# Patient Record
Sex: Female | Born: 1990 | Race: White | Hispanic: No | Marital: Single
Health system: Southern US, Community
[De-identification: ages and names within clinical notes are randomized; demographics above are authoritative.]

---
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
## 2012-01-09 ENCOUNTER — Ambulatory Visit: Payer: Self-pay | Admitting: Family Medicine

## 2012-04-28 ENCOUNTER — Ambulatory Visit: Payer: Self-pay | Admitting: Family Medicine

## 2012-05-02 ENCOUNTER — Ambulatory Visit: Payer: Self-pay | Admitting: Family Medicine

## 2012-05-05 ENCOUNTER — Ambulatory Visit: Payer: Self-pay | Admitting: Family Medicine

## 2012-05-28 ENCOUNTER — Ambulatory Visit: Payer: Self-pay | Admitting: Family Medicine

## 2012-06-11 ENCOUNTER — Ambulatory Visit: Payer: Self-pay | Admitting: Family Medicine

## 2012-07-23 ENCOUNTER — Ambulatory Visit: Payer: Self-pay | Admitting: Family Medicine

## 2012-07-30 ENCOUNTER — Ambulatory Visit: Payer: Self-pay | Admitting: Family Medicine

## 2012-09-09 ENCOUNTER — Ambulatory Visit: Payer: Self-pay | Admitting: Family Medicine

## 2012-12-25 ENCOUNTER — Ambulatory Visit: Payer: Self-pay | Admitting: Family Medicine

## 2013-02-09 ENCOUNTER — Emergency Department: Payer: Self-pay | Admitting: Emergency Medicine

## 2013-02-09 LAB — COMPREHENSIVE METABOLIC PANEL
Albumin: 4.2 g/dL (ref 3.4–5.0)
Alkaline Phosphatase: 70 U/L (ref 50–136)
Anion Gap: 5 — ABNORMAL LOW (ref 7–16)
BUN: 19 mg/dL — ABNORMAL HIGH (ref 7–18)
Chloride: 104 mmol/L (ref 98–107)
Co2: 27 mmol/L (ref 21–32)
EGFR (African American): >60
Osmolality: 275 (ref 275–301)
Potassium: 3.4 mmol/L — ABNORMAL LOW (ref 3.5–5.1)
SGOT(AST): 36 U/L (ref 15–37)
SGPT (ALT): 31 U/L (ref 12–78)
Total Protein: 8 g/dL (ref 6.4–8.2)

## 2013-02-09 LAB — CBC WITH DIFFERENTIAL/PLATELET
Basophil #: 0.1 10*3/uL (ref 0.0–0.1)
Basophil %: 0.7 %
Eosinophil #: 0.1 10*3/uL (ref 0.0–0.7)
HGB: 13.7 g/dL (ref 12.0–16.0)
Lymphocyte #: 1.7 10*3/uL (ref 1.0–3.6)
Lymphocyte %: 22.1 %
MCH: 29.1 pg (ref 26.0–34.0)
MCV: 84 fL (ref 80–100)
Monocyte #: 0.6 x10 3/mm (ref 0.2–0.9)
Monocyte %: 7.5 %
Neutrophil %: 68.3 %
Platelet: 266 10*3/uL (ref 150–440)
RBC: 4.72 10*6/uL (ref 3.80–5.20)

## 2013-07-02 ENCOUNTER — Ambulatory Visit: Payer: Self-pay | Admitting: Family Medicine

## 2013-07-06 ENCOUNTER — Ambulatory Visit: Payer: Self-pay | Admitting: Family Medicine

## 2013-12-14 ENCOUNTER — Ambulatory Visit: Payer: Self-pay | Admitting: Family Medicine

## 2013-12-16 ENCOUNTER — Ambulatory Visit: Payer: Self-pay | Admitting: Family Medicine

## 2013-12-19 ENCOUNTER — Emergency Department: Payer: Self-pay | Admitting: Emergency Medicine

## 2013-12-19 LAB — CBC WITH DIFFERENTIAL/PLATELET
Basophil #: 0 10*3/uL (ref 0.0–0.1)
Basophil %: 0.7 %
Eosinophil #: 0.1 10*3/uL (ref 0.0–0.7)
Eosinophil %: 2.5 %
HCT: 39.7 % (ref 35.0–47.0)
HGB: 12.5 g/dL (ref 12.0–16.0)
LYMPHS ABS: 1.3 10*3/uL (ref 1.0–3.6)
Lymphocyte %: 23.7 %
MCH: 26.6 pg (ref 26.0–34.0)
MCHC: 31.5 g/dL — AB (ref 32.0–36.0)
MCV: 85 fL (ref 80–100)
MONOS PCT: 8.7 %
Monocyte #: 0.5 x10 3/mm (ref 0.2–0.9)
NEUTROS ABS: 3.4 10*3/uL (ref 1.4–6.5)
Neutrophil %: 64.4 %
PLATELETS: 260 10*3/uL (ref 150–440)
RBC: 4.69 10*6/uL (ref 3.80–5.20)
RDW: 14.4 % (ref 11.5–14.5)
WBC: 5.3 10*3/uL (ref 3.6–11.0)

## 2013-12-19 LAB — COMPREHENSIVE METABOLIC PANEL
AST: 28 U/L (ref 15–37)
Albumin: 4 g/dL (ref 3.4–5.0)
Alkaline Phosphatase: 46 U/L
Anion Gap: 7 (ref 7–16)
BUN: 15 mg/dL (ref 7–18)
Bilirubin,Total: 0.3 mg/dL (ref 0.2–1.0)
CALCIUM: 9.6 mg/dL (ref 8.5–10.1)
CREATININE: 1.19 mg/dL (ref 0.60–1.30)
Chloride: 107 mmol/L (ref 98–107)
Co2: 25 mmol/L (ref 21–32)
EGFR (African American): >60
GLUCOSE: 86 mg/dL (ref 65–99)
OSMOLALITY: 278 (ref 275–301)
Potassium: 4 mmol/L (ref 3.5–5.1)
SGPT (ALT): 30 U/L
Sodium: 139 mmol/L (ref 136–145)
TOTAL PROTEIN: 8.2 g/dL (ref 6.4–8.2)

## 2013-12-19 LAB — MONONUCLEOSIS SCREEN: Mono Test: NEGATIVE

## 2013-12-19 LAB — TROPONIN I

## 2014-05-28 IMAGING — CR RIGHT LITTLE FINGER 2+V
1 series · 3 of 3 positions shown · non-contrast
Comparison: none

REASON FOR EXAM: athlete hit in the 5th digit while playing basketball
presents with mallet finge
COMMENTS:

[Series 1: pa · 0.17mm/px · 3 of 3 slices shown]
[im 1/3]
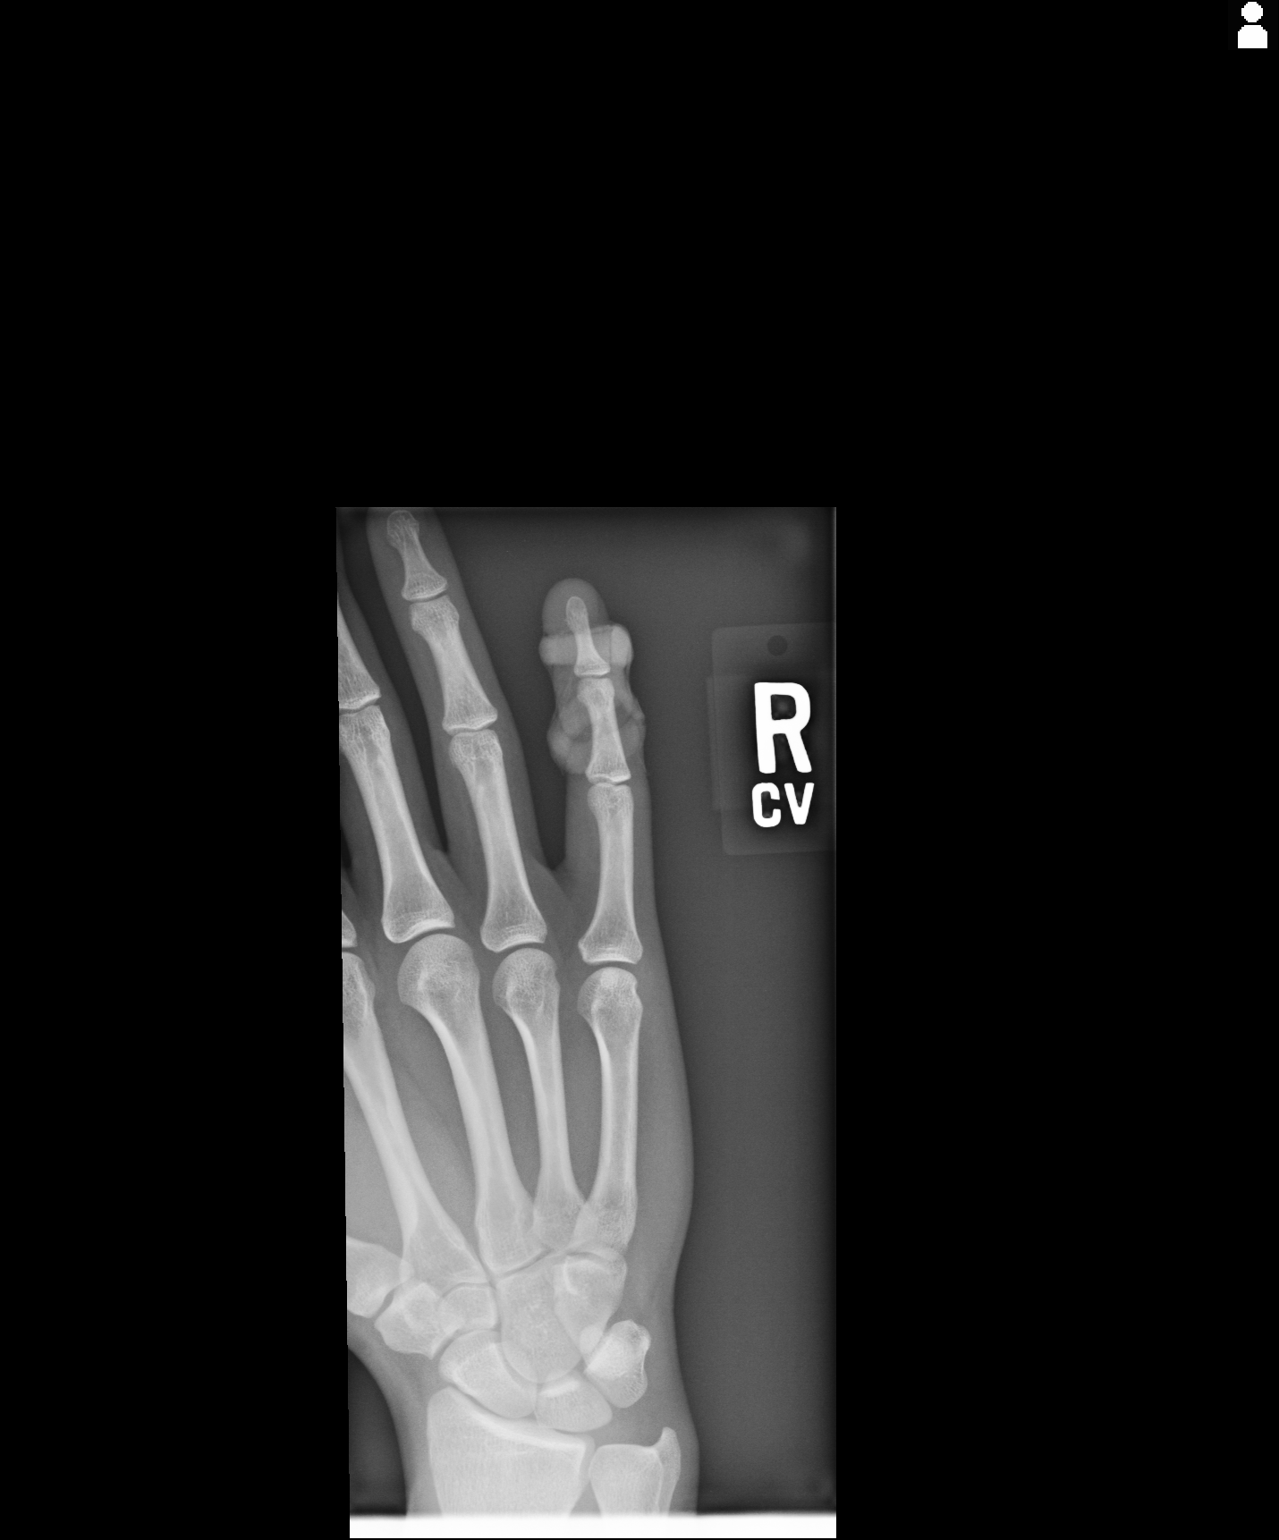
[im 2/3]
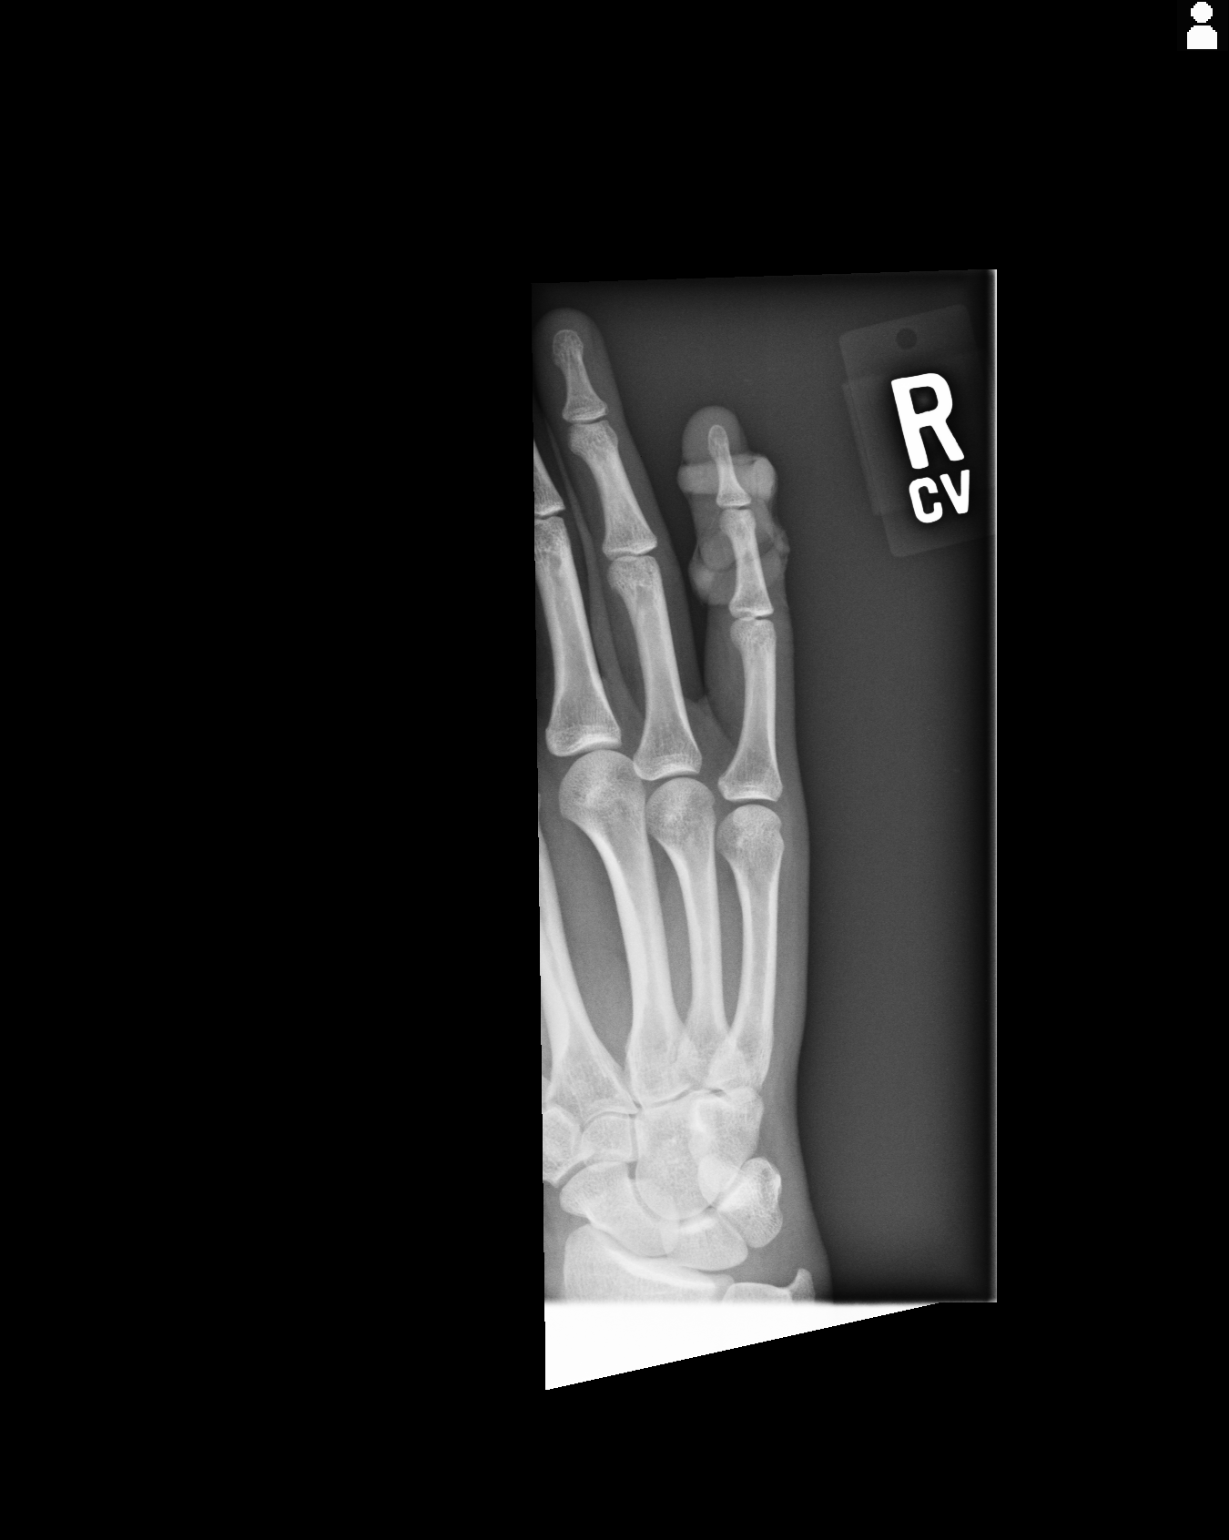
[im 3/3]
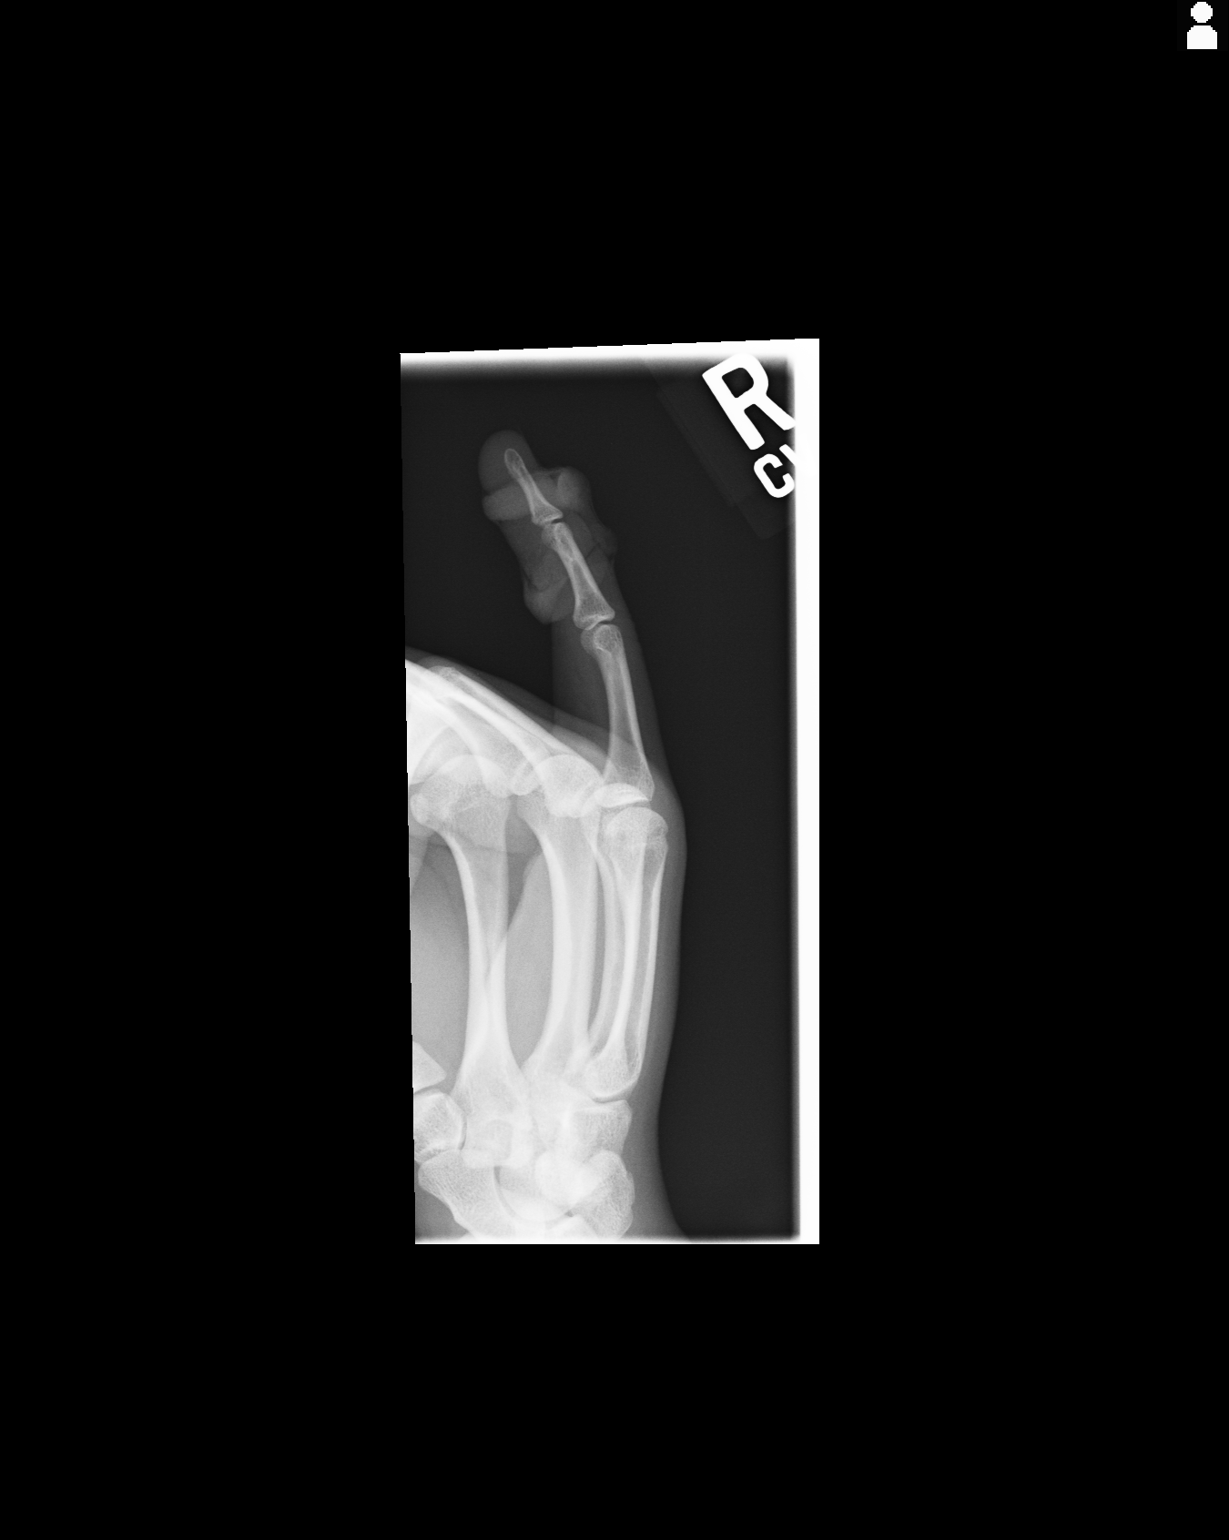

[3 of 3 positions shown; findings below may reference images not displayed]

PROCEDURE:     KDR - KDXR FINGER PINKY 5TH DIG RT MCANALLY  - May 28, 2012 [DATE]

RESULT:     Findings: 3 views of the right fifth digit are provided. There
is bandaging material overlying the middle and distal phalanx which limits
bone detail. There is no fracture or dislocation. There is soft tissue
swelling surrounding the DIP joint.
IMPRESSION: Please see above.

## 2014-06-16 ENCOUNTER — Ambulatory Visit: Admit: 2014-06-16 | Disposition: A | Discharge status: Home / Self Care | Payer: Self-pay | Admitting: Family Medicine

## 2014-07-22 NOTE — Consult Note (Signed)
PATIENT NAME:  Heidi BrittleHUDSON, Suprina MR#:  [id number] DATE OF BIRTH:  [birth year]  DATE OF CONSULTATION:  02/09/2013  REFERRING PHYSICIAN:  Dr. Silverio LayYao  CONSULTING PHYSICIAN:  Kyung Ruddreighton C. Jarold Macomber, MD  REASON FOR CONSULTATION:  Possible mastoiditis.   HISTORY OF PRESENT ILLNESS: The patient is a 24 year old female who presents today with a one-day history of a rash behind her right neck and pain in her neck. [facility] and diagnosed with possible shingles of the right ear. Her family was concerned and brought her into the Emergency Room for evaluation and there is a  question was  for possible mastoiditis versus otitis externa versus other etiology and I was asked for evaluation. She reports that she woke this morning in otherwise good health and then over the course of the day she developed a rash and pain in her neck. She does work out at [employer].  She also is basketball player. She denies any recent trauma. She denies any hearing change, she denies any pain in her ear.  She denies any dizziness, fever or other similar prior experiences.    PAST MEDICAL HISTORY: Significant for asthma.   PAST SURGICAL HISTORY: None on the head and neck.   ALLERGIES: SULFA.   CURRENT MEDICATIONS:  Include famciclovir.   REVIEW OF SYSTEMS: Positive for neck pain and rash. Negative for dizziness or otalgia or change in hearing or fever.    PHYSICAL EXAMINATION: VITAL SIGNS: Temperature 97.9, pulse 58, respirations 20, blood pressure 126/69, pulse oximetry 96% on room air.  GENERAL: She is a well-nourished, well-developed female in no acute distress.  EARS: External ears are clear. EACs are clear bilaterally. TMs are intact. No perforation or effusion. The drum non-red and non-bulging. Post-auricularly on the right, she has swelling that side tender and oval, overlying her mastoid, and a mild erythema of the skin just below that and posterior to the angle of the mandible and she has a tender lymph node. There is  no other lymphadenopathy. There is no area of fluctuance. Her scalp does not reveal any ulceration or soars.   NOSE: Clear to anterior examination. No mucopus polyps.  ORAL CAVITY AND OROPHARYNX: Reveals no abnormal masses or lesions.  NECK: Supple. No other lymphadenopathy other than the postauricular and posterior to the angle of mandible ride.   IMPRESSION: Right-sided lymphadenopathy, unknown source in a nontoxic individual within normal middle ear space.   PLAN: I discussed my findings with the patient and her family. I do not feel that this is shingles. Also do not feel that this is otitis externa or mastoiditis. If feel most likely, this is from cut in  her skin that bacteria has traveled to the posterior auricular lymph node it has become inflamed. I have recommended treatment for cellulitis, possible staph and we decided to proceed with clindamycin as well as steroid taper. I will see her back in two days for repeat evaluation. I do feel that she can stop the antiviral medications.  ____________________________ Kyung Ruddreighton C. Cambree Hendrix, MD ccv:cc D: 02/09/2013 20:06:45 ET T: 02/09/2013 20:27:24 ET JOB#: [id number]  cc: Kyung Ruddreighton C. Braylynn Lewing, MD, <Dictator> Kyung RuddREIGHTON C Jaylenn Baiza MD ELECTRONICALLY SIGNED 02/12/2013 7:18

## 2014-07-22 NOTE — Consult Note (Signed)
Brief Consult Note: Diagnosis: Lymphadenitis.   Patient was seen by consultant.   Consult note dictated.   Recommend further assessment or treatment.   Discussed with Attending MD.   Comments: 24 y.o. with 1 day history of right post-auricular swelling and erythema.  On exam, normal middle ear status and non-toxic.  +postauricular edema and erythema and retromandibular lymphadenopathy.  No obvious sore noted.  Impression:  Lymphadenopathy Plan:  1)  Clindamycin 300 TID X 10 days 2) Prednisone taper over 6 days 3)  Follow up with [facility] on Thursday 02/11/13.  May call [phone] for scheduling.  Electronic Signatures: Gregori Abril, Rayfield Citizenreighton Charles (MD)  (Signed [phone]-Nov-14 20:09)  Authored: Brief Consult Note   Last Updated: 11-Nov-14 20:09 by Flossie DibbleVaught, Ellanore Vanhook Charles (MD)
